# Patient Record
Sex: Male | Born: 2000 | Race: Black or African American | Hispanic: No | Marital: Single | Smoking: Never smoker
Health system: Southern US, Community
[De-identification: ages and names within clinical notes are randomized; demographics above are authoritative.]

---
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
## 2020-01-23 IMAGING — MR MR KNEE*L* W/O CM
7 series · 40 of 40 positions shown · non-contrast
Comparison: None.

CLINICAL DATA: Left knee pain since an injury playing football 5
days ago.

EXAM:
MRI OF THE LEFT KNEE WITHOUT CONTRAST
TECHNIQUE: Multiplanar, multisequence MR imaging of the knee was performed. No
intravenous contrast was administered.

[Series 8: T1 · coronal · left · 4.0mm · 0.70mm/px · 7 of 30 slices shown]
[im 1/30]
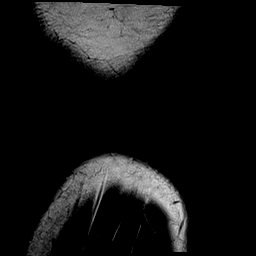
[im 5/30]
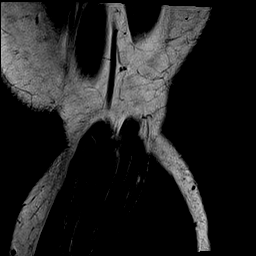
[im 10/30]
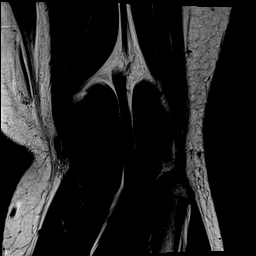
[im 15/30]
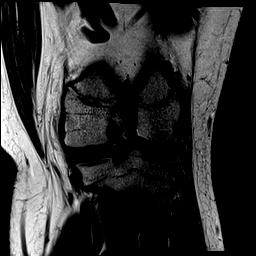
[im 20/30]
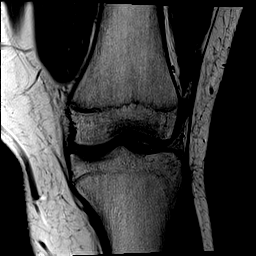
[im 25/30]
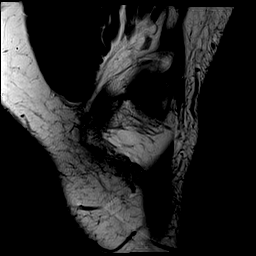
[im 30/30]
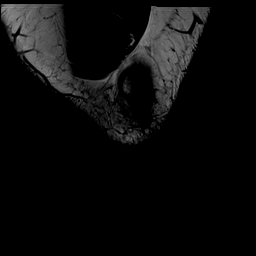

[Series 9: PD fat-sat · coronal · left · 4.0mm · 0.56mm/px · 6 of 30 slices shown (1 of 2)]
[im 1/30]
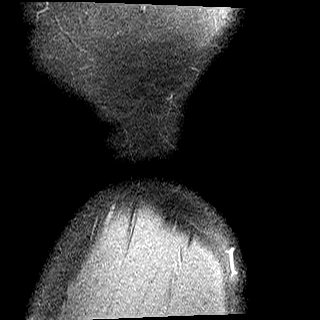
[im 6/30]
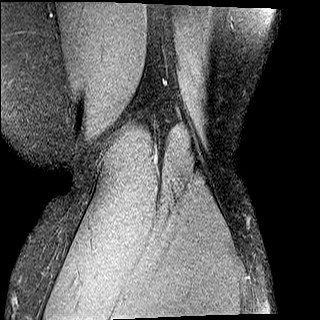
[im 12/30]
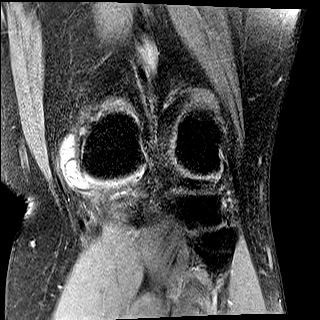
[im 18/30]
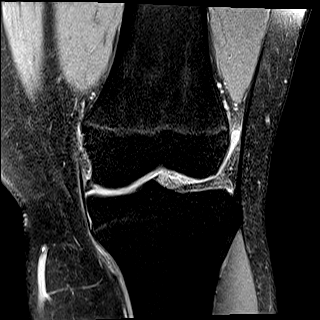
[im 24/30]
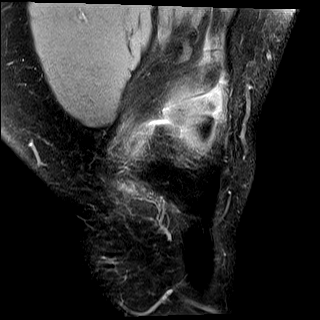
[im 30/30]
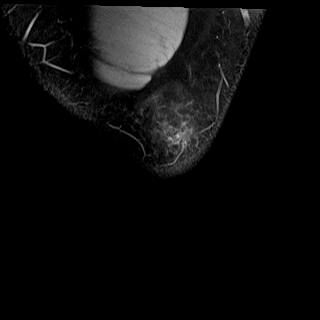

[Series 10: PD fat-sat · sagittal · left · 3.5mm · 0.70mm/px · 6 of 27 slices shown (2 of 2)]
[im 1/27]
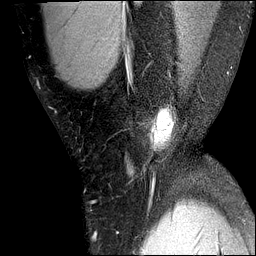
[im 6/27]
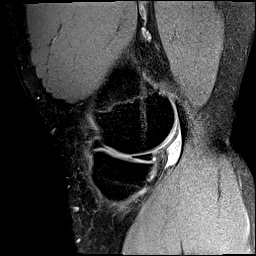
[im 11/27]
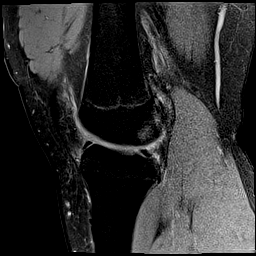
[im 16/27]
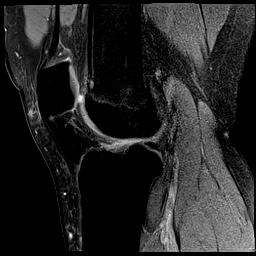
[im 21/27]
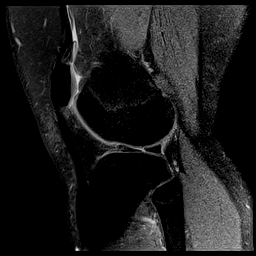
[im 27/27]
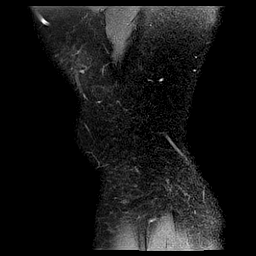

[Series 12: T2 fat-sat · sagittal · left · 3.5mm · 0.70mm/px · 6 of 27 slices shown (1 of 2)]
[im 1/27]
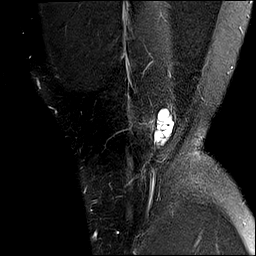
[im 6/27]
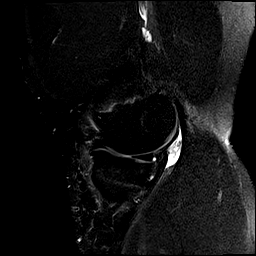
[im 11/27]
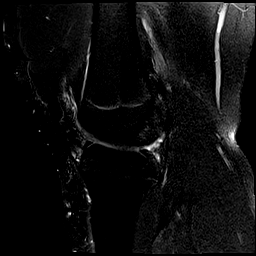
[im 16/27]
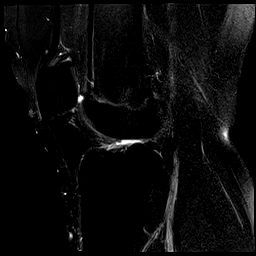
[im 21/27]
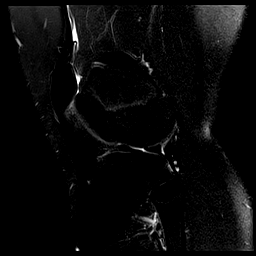
[im 27/27]
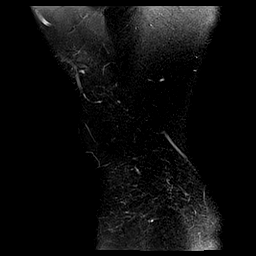

[Series 15: PD · oblique · left · 2.5mm · 0.47mm/px · 4 of 18 slices shown]
[im 1/18]
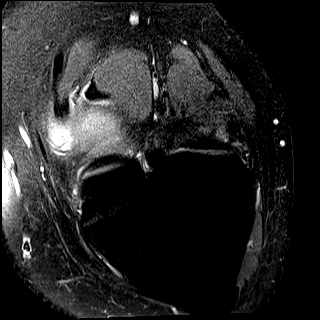
[im 6/18]
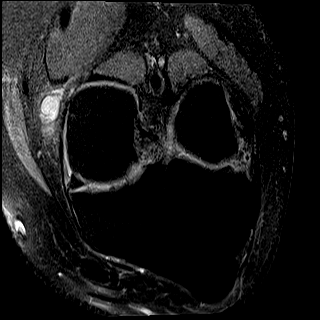
[im 12/18]
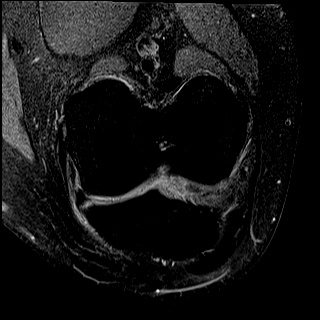
[im 18/18]
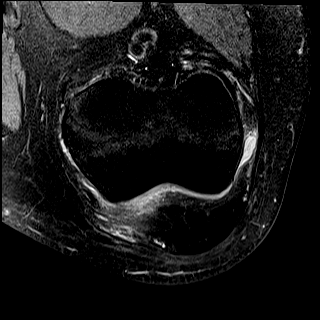

[Series 17: T2 fat-sat · coronal · left · 4.0mm · 0.56mm/px · 6 of 30 slices shown (2 of 2)]
[im 1/30]
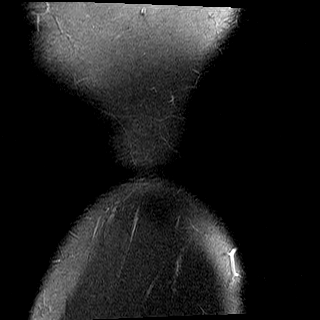
[im 6/30]
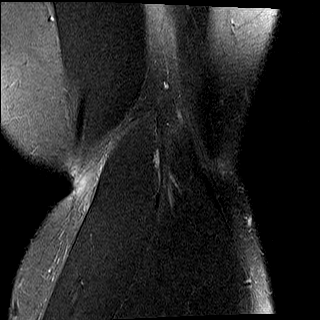
[im 12/30]
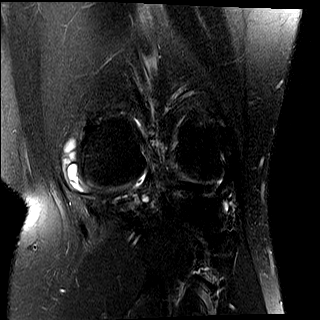
[im 18/30]
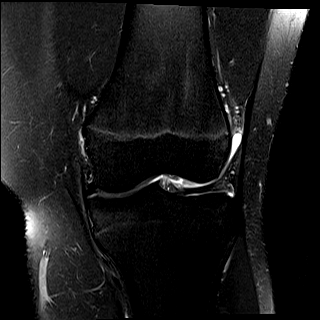
[im 24/30]
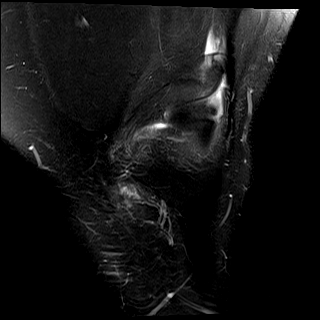
[im 30/30]
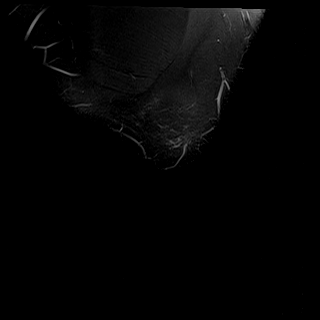

[Series 102: T2 · axial · left · 4.0mm · 0.45mm/px · z∈[-49,+65]mm · 5 of 24 slices shown]
[im 1/24]
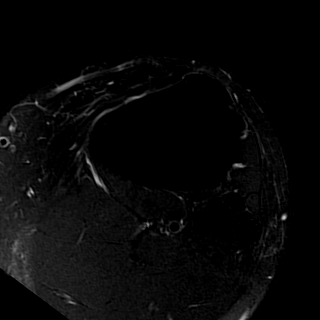
[im 6/24]
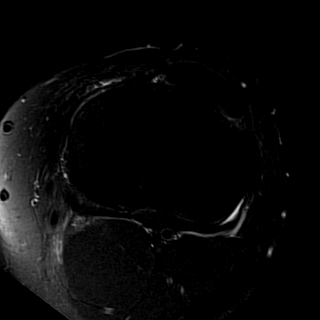
[im 12/24]
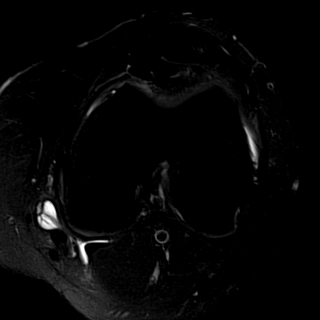
[im 18/24]
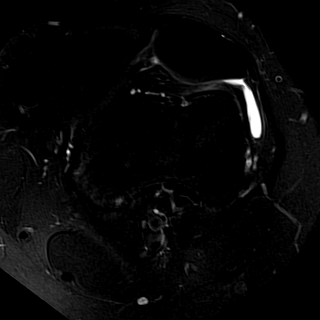
[im 24/24]
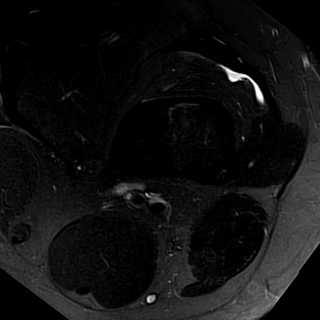

[40 of 40 positions shown; findings below may reference images not displayed]

FINDINGS: The standard knee coil could not be used on this examination due to
the patient's size which mildly limits the study.

MENISCI

Medial meniscus:  Intact.

Lateral meniscus:  There is a large radial tear in the central body.

LIGAMENTS

Cruciates: Intact. Extensive intrasubstance increased T2 signal in
the anterior cruciate ligament consistent with mucoid degeneration
is identified.

Collaterals:  Intact.

CARTILAGE

Patellofemoral:  Preserved.

Medial:  Preserved.

Lateral:  Preserved.

Joint:  Small effusion.

Popliteal Fossa: Baker's cyst measures approximately 0.7 x 3.0 cm in
the axial plane x 4.6 cm craniocaudal.

Extensor Mechanism:  Intact.  Lateral tilt of the patella is noted.

Bones:  Normal marrow signal throughout.

Other: None.
IMPRESSION: Large radial tear central body of the lateral meniscus.

Mucoid degeneration of the ACL without tear.

Baker's cyst.

## 2022-09-07 ENCOUNTER — Other Ambulatory Visit: Payer: Self-pay | Admitting: Nurse Practitioner

## 2022-09-07 DIAGNOSIS — W450XXA Nail entering through skin, initial encounter: Secondary | ICD-10-CM

## 2022-09-07 MED ORDER — TETANUS-DIPHTH-ACELL PERTUSSIS 5-2-15.5 LF-MCG/0.5 IM SUSP: 0.5000 mL | Freq: Once | INTRAMUSCULAR | 0 refills | Status: AC

## 2022-09-07 MED ORDER — SULFAMETHOXAZOLE-TRIMETHOPRIM 800-160 MG PO TABS: 1.0000 | ORAL_TABLET | Freq: Two times a day (BID) | ORAL | 0 refills | Status: AC

## 2023-10-12 ENCOUNTER — Other Ambulatory Visit: Payer: Self-pay | Admitting: Registered Nurse

## 2023-10-12 DIAGNOSIS — R109 Unspecified abdominal pain: Secondary | ICD-10-CM

## 2023-10-13 ENCOUNTER — Ambulatory Visit
Admission: RE | Admit: 2023-10-13 | Discharge: 2023-10-13 | Disposition: A | Discharge status: Home / Self Care | Payer: Medicaid Other | Source: Ambulatory Visit | Attending: Registered Nurse | Admitting: Registered Nurse

## 2023-10-13 DIAGNOSIS — R109 Unspecified abdominal pain: Secondary | ICD-10-CM

## 2023-10-19 ENCOUNTER — Encounter: Payer: Self-pay | Admitting: Physical Therapy

## 2023-10-19 ENCOUNTER — Ambulatory Visit: Payer: Medicaid Other | Attending: Registered Nurse | Admitting: Physical Therapy

## 2023-10-19 ENCOUNTER — Other Ambulatory Visit: Payer: Self-pay

## 2023-10-19 DIAGNOSIS — M6281 Muscle weakness (generalized): Secondary | ICD-10-CM | POA: Insufficient documentation

## 2023-10-19 DIAGNOSIS — M5459 Other low back pain: Secondary | ICD-10-CM | POA: Diagnosis present

## 2023-10-19 NOTE — Therapy (Signed)
OUTPATIENT PHYSICAL THERAPY THORACOLUMBAR EVALUATION  Patient Name: Kyle [name] MRN: [id number] DOB:2000-12-15, 23 y.o., male Today's Date: 10/19/2023   PT End of Session - 10/19/23 1053     Visit Number 1    Number of Visits --   1-2x/week   Date for PT Re-Evaluation 12/14/23    Authorization Type [employer] - FOTO    PT Start Time 0830    PT Stop Time 0905    PT Time Calculation (min) 35 min             History reviewed. No pertinent past medical history. Past Surgical History:  Procedure Laterality Date   KNEE ARTHROSCOPY WITH LATERAL MENISECTOMY Left 05/15/2018   Procedure: LEFT KNEE ARTHROSCOPY WITH LATERAL MENISECTOMY;  Surgeon: Kyle Pippin, MD;  Location: [facility];  Service: Orthopedics;  Laterality: Left;   There are no active problems to display for this patient.   PCP: Patient, No Pcp Per  REFERRING PROVIDER: McCaskill-Gainey, [name]*  THERAPY DIAG:  Other low back pain  Muscle weakness  REFERRING DIAG: Low back pain [M54.50]   Rationale for Evaluation and Treatment:  Rehabilitation  SUBJECTIVE:  PERTINENT PAST HISTORY:  none        PRECAUTIONS: None  WEIGHT BEARING RESTRICTIONS No  FALLS:  Has patient fallen in last 6 months? No, Number of falls: 0  MOI/History of condition:  Onset date: 6 years  SUBJECTIVE STATEMENT  Kyle [name] is a 23 y.o. male who presents to clinic with chief complaint of l sided low back pain.  Pt reports that the pain started when after a football injury about 6 years ago.  He does not remember exactly what happened but "I couldn't move".  The pain is intermittent but comes every day.  He leans to his L when he is sitting to avoid the pain.  He feels that it got worse after he did back squats.  He denies radicular pain pain or n/t.   Red flags:  denies   Pain:  Are you having pain? 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  Assistive Device: na  Hand Dominance: na  Patient Goals/Specific Activities: reduce pain   OBJECTIVE:   DIAGNOSTIC FINDINGS:  NA  GENERAL OBSERVATION/GAIT: WNL  SENSATION: Light touch: Appears intact  LUMBAR AROM  AROM AROM  (Eval)  Flexion Fingertips to knees, w/ concordant pain  Extension WNL  Right lateral flexion WNL  Left lateral flexion WNL  Right rotation WNL  Left rotation WNL    (Blank rows = not tested)   LE MMT:  MMT Right (Eval) Left (Eval)  Hip flexion (L2, L3) 5 4+  Knee extension (L3)    Knee flexion    Hip abduction 4 4  Hip extension 4+* 4+*  Hip external rotation n n  Hip internal rotation n n  Hip adduction    Ankle dorsiflexion (L4)    Ankle plantarflexion (S1)    Ankle inversion    Ankle eversion    Great Toe ext (L5)    Grossly     (Blank rows = not tested, score listed is out of 5 possible points.  N = WNL, D = diminished, C = clear for gross weakness with myotome  testing, * = concordant pain with testing)  SPECIAL TESTS:  Straight leg raise: L (-), R (-) Slump: L (-), R (-)  MUSCLE LENGTH: Hamstrings: Right no restriction; Left no restriction Kyle [name] test: Right significant restriction; Left significant restriction Ely's test: Right no restriction; Left no restriction Piriformis stretch increases back pain  LE ROM:  ROM Right (Eval) Left (Eval)  Hip flexion    Hip extension    Hip abduction    Hip adduction    Hip internal rotation    Hip external rotation  Slightly limited  Knee flexion    Knee extension    Ankle dorsiflexion    Ankle plantarflexion    Ankle inversion    Ankle eversion      (Blank rows = not tested, N = WNL, * = concordant pain with testing)  Functional Tests  Eval    Plank: Pain at ~45''    Side  plank: 24'' R, 5'' pain R                                                        PALPATION:   TTP lumbar paraspinals ~L3  PATIENT SURVEYS:  FOTO 42 -> 59   TODAY'S TREATMENT  Therapeutic Exercise: Creating, reviewing, and completing below HEPs  PATIENT EDUCATION:  POC, diagnosis, prognosis, HEP, and outcome measures.  Pt educated via explanation, demonstration, and handout (HEP).  Pt confirms understanding verbally.   HOME EXERCISE PROGRAM: Access Code: [email or web address] URL: [email or web address] Date: 10/19/2023 Prepared by: Kyle [name]  Exercises - Supine Single Knee to Chest Stretch  - 2-3 x daily - 7 x weekly - 1 sets - 3 reps - 45 hold - Bird Dog  - 1 x daily - 5-7 x weekly - 1 sets - 10 reps - 10 second hold - Side Plank on Knees  - 1 x daily - 5-7 x weekly - 1 sets - 3 reps - 10-45 second hold  Treatment priorities   Eval        Lumbar flexion        Strengthen extensors        Bil RF? And L hip ER        DL progression        TDN          ASSESSMENT:  CLINICAL IMPRESSION: Kyle [name] is a 23 y.o. male who presents to clinic with signs and sxs consistent with L sided low back pain.  No sign of radiculopathy on exam.  Consistent with chronic lumbar strain from remote trauma.  Pt reports complete resolution of sxs following exercise and stretching.  Kyle [name] will benefit from skilled PT to address relevant deficits and improve comfort with daily activities including the gym and work.  OBJECTIVE IMPAIRMENTS: Pain, lumbar ROM, core and hip strength  ACTIVITY LIMITATIONS: recreation (gym), work [employer], retail), lifting, bending  PERSONAL FACTORS: See medical history and pertinent history   REHAB POTENTIAL: Good  CLINICAL DECISION MAKING: Stable/uncomplicated  EVALUATION COMPLEXITY: Low   GOALS:   SHORT TERM GOALS: Target date: 11/16/2023   Kyle [name] will be >75% HEP compliant to improve carryover between sessions and facilitate independent  management of condition  Evaluation: ongoing Goal status: INITIAL   LONG TERM GOALS: Target date: 12/14/2023   Kyle [name] will improve FOTO score to 59 as a proxy for  functional improvement  Evaluation/Baseline: 42 Goal status: INITIAL    2.  Kyle [name] will self report >/= 50% decrease in pain from evaluation to improve function in daily tasks  Evaluation/Baseline: 10/10 max pain daily Goal status: INITIAL   3.  Davey will be able to maintain side plank from feet for 25'' (norm for healthy adult is 20 - 30'' from feet)   Evaluation/Baseline: L 24''  R 5'' from feet Goal status: INITIAL   4.  Regie will be able to maintain plank for 70'' from feet (norm for healthy adult is ~70'' from feet)   Evaluation/Baseline: 45'' from feet with pain Goal status: INITIAL   5.  Lambert will be able to complete gym workouts and work [employer], retail), not limited by pain  Evaluation/Baseline: limited Goal status: INITIAL   6.  Torrez will report confidence in self management of condition at time of discharge with advanced HEP  Evaluation/Baseline: unable to self manage Goal status: INITIAL   PLAN: PT FREQUENCY: 1-2x/week  PT DURATION: 8 weeks  PLANNED INTERVENTIONS:  97164- PT Re-evaluation, 97110-Therapeutic exercises, 97530- Therapeutic activity, [id number]- Neuromuscular re-education, 97535- Self Care, 16109- Manual therapy, [id number]- Gait training, [id number]- Aquatic Therapy, [id number]- Electrical stimulation (manual), [id number]- Vasopneumatic device, [id number]- Traction (mechanical), [id number]- Ionotophoresis 4mg /ml Dexamethasone, Taping, Dry Needling, Joint manipulation, and Spinal manipulation.   Kyle [name] PT, DPT 10/19/2023, 10:56 AM  I just finished a MCD eval/recert.  Name: RMANI SALVAS  MRN: [id number] Please request 1x/week for 8 weeks.  Check all conditions that are expected to impact treatment: Musculoskeletal disorders   Check all possible CPT codes: 19147- Therapeutic Exercise, [phone]- Neuro  Re-education, [phone] - Gait Training, [phone] - Manual Therapy, 97530 - Therapeutic Activities, 97535 - Self Care, [phone] - Re-evaluation, [id number] - Mechanical traction, and [id number] - Aquatic therapy   Thank you!  MCD - Secure

## 2023-10-20 ENCOUNTER — Encounter: Payer: Self-pay | Admitting: Physical Medicine and Rehabilitation

## 2023-10-20 ENCOUNTER — Ambulatory Visit: Payer: Medicaid Other | Admitting: Physical Medicine and Rehabilitation

## 2023-10-20 ENCOUNTER — Other Ambulatory Visit (INDEPENDENT_AMBULATORY_CARE_PROVIDER_SITE_OTHER): Payer: Medicaid Other

## 2023-10-20 DIAGNOSIS — M7918 Myalgia, other site: Secondary | ICD-10-CM | POA: Diagnosis not present

## 2023-10-20 DIAGNOSIS — M545 Low back pain, unspecified: Secondary | ICD-10-CM | POA: Diagnosis not present

## 2023-10-20 DIAGNOSIS — G8929 Other chronic pain: Secondary | ICD-10-CM | POA: Diagnosis not present

## 2023-10-20 NOTE — Progress Notes (Signed)
Kyle [name] - 23 y.o. male MRN [id number]  Date of birth: Feb 20, 2001  Office Visit Note: Visit Date: 10/20/2023 PCP: Aurelio Brash, FNP Referred by: No ref. provider found  Subjective: Chief Complaint  Patient presents with   Lower Back - Pain   HPI: Kyle [name] is a 23 y.o. male who comes in today per the request of Aurelio Brash, NP for evaluation of chronic, worsening and severe left sided lower back pain. Pain ongoing since football injury while in high school, circa 2018. States his pain did resolve for several years initially, however is now more constant. His pain becomes worse with movement and activity. He describes pain as sore and aching sensation, currently rates as 9 out of 10. Some relief of pain with home exercise regimen, rest and use of medications. No relief of pain with Ibuprofen. He recently started physical therapy on [address] yesterday. No history of lumbar surgery/injections. He was previously evaluated by Dr. Ramond Marrow with [facility] for both lower back and left knee pain. He underwent left knee arthroscopy with Dr. Everardo Pacific in 2019. Patient denies focal weakness numbness and tingling. No recent trauma or falls.      Review of Systems  Musculoskeletal:  Positive for back pain.  Neurological:  Negative for tingling, sensory change, focal weakness and weakness.  All other systems reviewed and are negative.  Otherwise per HPI.  Assessment & Plan: Visit Diagnoses:    ICD-10-CM   1. Chronic bilateral low back pain without sciatica  M54.50 XR Lumbar Spine Complete   G89.29     2. Myofascial pain syndrome  M79.18        Plan: Findings:  Chronic, worsening and severe left sided lower back pain. No radicular symptoms down the legs. Patient continues to have severe pain despite good conservative therapies such as formal physical therapy, home exercise regimen, rest and use of medications. Patients clinical presentation  and exam are complex, his symptoms do seem to fit with more myofascial pain. I obtained lumbar radiographs in the office today that show normal anatomical alignment, well preserved disc spacing, no listhesis, no pars defects. X-rays are fairly normal for his age. We discussed treatment plan in detail today. I would like patient to continue with formal physical therapy, he can continue with daily home exercise regimen/resistance training. I cautioned him to avoid any exercises that cause worsening pain. I also discussed obtaining lumbar MRI imaging due to chronicity of pain. He would like to call his insurance company to check on out of pocket costs for MRI imaging. He will let us know if he would like to proceed with lumbar MRI imaging. He has no questions at this time. No red flag symptoms noted upon exam today.     Meds & Orders: No orders of the defined types were placed in this encounter.   Orders Placed This Encounter  Procedures   XR Lumbar Spine Complete    Follow-up: Return if symptoms worsen or fail to improve.   Procedures: No procedures performed      Clinical History: No specialty comments available.   He reports that he has never smoked. He has never used smokeless tobacco. No results for input(s): "HGBA1C", "LABURIC" in the last 8760 hours.  Objective:  VS:  HT:    WT:   BMI:     BP:   HR: bpm  TEMP: ( )  RESP:  Physical Exam Vitals and nursing note reviewed.  HENT:  Head: Normocephalic and atraumatic.     Right Ear: External ear normal.     Left Ear: External ear normal.     Nose: Nose normal.     Mouth/Throat:     Mouth: Mucous membranes are moist.  Eyes:     Extraocular Movements: Extraocular movements intact.  Cardiovascular:     Rate and Rhythm: Normal rate.     Pulses: Normal pulses.  Pulmonary:     Effort: Pulmonary effort is normal.  Abdominal:     General: Abdomen is flat. There is no distension.  Musculoskeletal:        General: Tenderness  present.     Cervical back: Normal range of motion.     Comments: Patient rises from seated position to standing without difficulty. Good lumbar range of motion. No pain noted with facet loading. 5/5 strength noted with bilateral hip flexion, knee flexion/extension, ankle dorsiflexion/plantarflexion and EHL. No clonus noted bilaterally. No pain upon palpation of greater trochanters. No pain with internal/external rotation of bilateral hips. Sensation intact bilaterally. Myofascial tenderness noted to left lumbar paraspinal region. Negative slump test bilaterally. Ambulates without aid, gait steady.     Skin:    General: Skin is warm and dry.     Capillary Refill: Capillary refill takes less than 2 seconds.  Neurological:     General: No focal deficit present.     Mental Status: He is alert and oriented to person, place, and time.  Psychiatric:        Mood and Affect: Mood normal.        Behavior: Behavior normal.     Ortho Exam  Imaging: XR Lumbar Spine Complete Result Date: 10/20/2023 Radiographs of lumbar spine show normal anatomical alignment, well preserved disc spacing, no spondylolisthesis. No pars defects. No fractures or dislocations.   Past Medical/Family/Surgical/Social History: Medications & Allergies reviewed per EMR, new medications updated. There are no active problems to display for this patient.  History reviewed. No pertinent past medical history. History reviewed. No pertinent family history. Past Surgical History:  Procedure Laterality Date   KNEE ARTHROSCOPY WITH LATERAL MENISECTOMY Left 05/15/2018   Procedure: LEFT KNEE ARTHROSCOPY WITH LATERAL MENISECTOMY;  Surgeon: Bjorn Pippin, MD;  [facility];  Service: Orthopedics;  Laterality: Left;   Social History   Occupational History   Not on file  Tobacco Use   Smoking status: Never   Smokeless tobacco: Never  Vaping Use   Vaping status: Never Used  Substance and Sexual Activity    Alcohol use: Never   Drug use: Never   Sexual activity: Never

## 2023-10-20 NOTE — Patient Instructions (Signed)

## 2023-10-20 NOTE — Progress Notes (Signed)
Lower back pain.  Highschool football injury 6 years ago.  Constant back pain, he said he went to PT yesterday did some stretches and the pain came back.  Tylenol for pain but no help.  L > R

## 2023-11-03 ENCOUNTER — Encounter: Payer: Self-pay | Admitting: Physical Therapy

## 2023-11-03 ENCOUNTER — Ambulatory Visit: Payer: Medicaid Other | Attending: Registered Nurse | Admitting: Physical Therapy

## 2023-11-03 DIAGNOSIS — M5459 Other low back pain: Secondary | ICD-10-CM | POA: Diagnosis present

## 2023-11-03 DIAGNOSIS — M6281 Muscle weakness (generalized): Secondary | ICD-10-CM | POA: Diagnosis present

## 2023-11-03 NOTE — Therapy (Signed)
 OUTPATIENT PHYSICAL THERAPY DAILY NOTE  Patient Name: Kyle [name] MRN: [id number] DOB:November 12, 2000, 23 y.o., male Today's Date: 11/03/2023   PT End of Session - 11/03/23 0915     Visit Number 2    Number of Visits --   1-2x/week   Date for PT Re-Evaluation 12/14/23    Authorization Type [employer] - FOTO    PT Start Time 0915    PT Stop Time 0957    PT Time Calculation (min) 42 min             History reviewed. No pertinent past medical history. Past Surgical History:  Procedure Laterality Date   KNEE ARTHROSCOPY WITH LATERAL MENISECTOMY Left 05/15/2018   Procedure: LEFT KNEE ARTHROSCOPY WITH LATERAL MENISECTOMY;  Surgeon: Cristy Bonner DASEN, MD;  Location:  [facility];  Service: Orthopedics;  Laterality: Left;   There are no active problems to display for this patient.   PCP: McCaskill-Gainey, Tenesa, FNP  REFERRING PROVIDER: McCaskill-Gainey, Tenes*  THERAPY DIAG:  Other low back pain  Muscle weakness  REFERRING DIAG: Low back pain [M54.50]   Rationale for Evaluation and Treatment:  Rehabilitation  SUBJECTIVE:  PERTINENT PAST HISTORY:  none        PRECAUTIONS: None  WEIGHT BEARING RESTRICTIONS No  FALLS:  Has patient fallen in last 6 months? No, Number of falls: 0  MOI/History of condition:  Onset date: 6 years  SUBJECTIVE STATEMENT  11/03/2023: Pt reports that overall he is feeling better.  He is completing his HEP stretches especially when it hurts.    EVAL: Kyle [name] is a 23 y.o. male who presents to clinic with chief complaint of l sided low back pain.  Pt reports that the pain started when after a football injury about 6 years ago.  He does not remember exactly what happened but I couldn't move.  The pain is intermittent but comes every day.  He leans to his L when he is sitting to avoid the pain.  He feels that it got worse after he did back squats.  He denies radicular pain pain or n/t.   Red flags:  denies   Pain:  Are  you having pain? 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  Assistive Device: na  Hand Dominance: na  Patient Goals/Specific Activities: reduce pain   OBJECTIVE:   DIAGNOSTIC FINDINGS:  NA  GENERAL OBSERVATION/GAIT: WNL  SENSATION: Light touch: Appears intact  LUMBAR AROM  AROM AROM  (Eval)  Flexion Fingertips to knees, w/ concordant pain  Extension WNL  Right lateral flexion WNL  Left lateral flexion WNL  Right rotation WNL  Left rotation WNL    (Blank rows = not tested)   LE MMT:  MMT Right (Eval) Left (Eval)  Hip flexion (L2, L3) 5 4+  Knee extension (L3)    Knee flexion    Hip abduction 4 4  Hip extension 4+* 4+*  Hip external rotation n n  Hip internal rotation n n  Hip adduction    Ankle dorsiflexion (L4)    Ankle plantarflexion (S1)    Ankle inversion    Ankle eversion    Great Toe ext (L5)    Grossly     (Blank rows = not tested,  score listed is out of 5 possible points.  N = WNL, D = diminished, C = clear for gross weakness with myotome testing, * = concordant pain with testing)  SPECIAL TESTS:  Straight leg raise: L (-), R (-) Slump: L (-), R (-)  MUSCLE LENGTH: Hamstrings: Right no restriction; Left no restriction Debby test: Right significant restriction; Left significant restriction Ely's test: Right no restriction; Left no restriction Piriformis stretch increases back pain  LE ROM:  ROM Right (Eval) Left (Eval)  Hip flexion    Hip extension    Hip abduction    Hip adduction    Hip internal rotation    Hip external rotation  Slightly limited  Knee flexion    Knee extension    Ankle dorsiflexion    Ankle plantarflexion    Ankle inversion    Ankle eversion      (Blank rows  = not tested, N = WNL, * = concordant pain with testing)  Functional Tests  Eval    Plank: Pain at ~45''    Side plank: 24'' R, 5'' pain R                                                        PALPATION:   TTP lumbar paraspinals ~L3  PATIENT SURVEYS:  FOTO 42 -> 59   TODAY'S TREATMENT  [facility] Adult PT Treatment  11/03/2023 :  Therapeutic Exercise: nu-step L5 52m while taking subjective and planning session with patient Prone leg lifts - 5'' holds - 2x10 Side plank from knees with clam - RTB - 2x10 Bridge from ball - 5'' hold - 2x10  Manual therapy: Skilled palpation to identify trigger points for TDN S/L ql stretch (L) STM to all listed muscles following TDN  Trigger Point Dry Needling  Initial Treatment: Pt instructed on Dry Needling rational, procedures, and possible side effects. Pt instructed to expect mild to moderate muscle soreness later in the day and/or into the next day.  Pt instructed in methods to reduce muscle soreness. Because Dry Needling was performed over or adjacent to a lung field, pt was educated on S/S of pneumothorax and to seek immediate medical attention should they occur.  Patient was educated on signs and symptoms of infection and other risk factors and advised to seek medical attention should they occur.  Patient verbalized understanding of these instructions and education.   Patient Verbal Consent Given: Yes Education Handout Provided: Yes Muscles Treated: bil lumbar paraspinals L3 - L5 Electrical Stimulation Performed: 10 min low frequency - milli amps - low intensity Treatment Response/Outcome: twitch      HOME EXERCISE PROGRAM: Access Code: [email or web address] URL: [email or web address] Date: 11/03/2023 Prepared by: Helene Gasmen  Exercises - Supine Single Knee to Chest Stretch  - 2-3 x daily - 7 x weekly - 1 sets - 3 reps - 45 hold - Bird Dog  - 1 x daily - 5-7 x weekly - 1 sets - 10 reps - 10 second hold -  Beginner Prone Single Leg Raise  - 1 x daily - 5-7 x weekly - 2 sets - 10 reps - 10 second hold - Side Plank with Clam and Resistance  - 1 x daily - 5-7 x weekly - 2 sets - 10 reps  Treatment priorities   Eval  Lumbar flexion        Strengthen extensors        Bil RF? And L hip ER        DL progression        TDN          ASSESSMENT:  CLINICAL IMPRESSION:  11/03/2023: Paxon tolerated session well with no adverse reaction.  Trialed TDN with estim with pt reports reduced muscle tension following.  Increased intensity of lumbar extensor exercises to include prone leg lifts which was well tolerated.  HEP updated accordingly.  EVAL:  Satish is a 23 y.o. male who presents to clinic with signs and sxs consistent with L sided low back pain.  No sign of radiculopathy on exam.  Consistent with chronic lumbar strain from remote trauma.  Pt reports complete resolution of sxs following exercise and stretching.  Estanislado will benefit from skilled PT to address relevant deficits and improve comfort with daily activities including the gym and work.  OBJECTIVE IMPAIRMENTS: Pain, lumbar ROM, core and hip strength  ACTIVITY LIMITATIONS: recreation (gym), work buyer, retail), lifting, bending  PERSONAL FACTORS: See medical history and pertinent history   REHAB POTENTIAL: Good  CLINICAL DECISION MAKING: Stable/uncomplicated  EVALUATION COMPLEXITY: Low   GOALS:   SHORT TERM GOALS: Target date: 11/16/2023   Mariah will be >75% HEP compliant to improve carryover between sessions and facilitate independent management of condition  Evaluation: ongoing Goal status: MET   LONG TERM GOALS: Target date: 12/14/2023   Grigor will improve FOTO score to 59 as a proxy for functional improvement  Evaluation/Baseline: 42 Goal status: INITIAL    2.  Sam will self report >/= 50% decrease in pain from evaluation to improve function in daily tasks  Evaluation/Baseline: 10/10 max pain daily Goal  status: INITIAL   3.  Tavish will be able to maintain side plank from feet for 25'' (norm for healthy adult is 20 - 30'' from feet)   Evaluation/Baseline: L 24''  R 5'' from feet Goal status: INITIAL   4.  Aarav will be able to maintain plank for 70'' from feet (norm for healthy adult is ~70'' from feet)   Evaluation/Baseline: 45'' from feet with pain Goal status: INITIAL   5.  Aladdin will be able to complete gym workouts and work buyer, retail), not limited by pain  Evaluation/Baseline: limited Goal status: INITIAL   6.  Kedrick will report confidence in self management of condition at time of discharge with advanced HEP  Evaluation/Baseline: unable to self manage Goal status: INITIAL   PLAN: PT FREQUENCY: 1-2x/week  PT DURATION: 8 weeks  PLANNED INTERVENTIONS:  97164- PT Re-evaluation, 97110-Therapeutic exercises, 97530- Therapeutic activity, [id number]- Neuromuscular re-education, 97535- Self Care, 02859- Manual therapy, [id number]- Gait training, [id number]- Aquatic Therapy, [id number]- Electrical stimulation (manual), [id number]- Vasopneumatic device, [id number]- Traction (mechanical), [id number]- Ionotophoresis 4mg /ml Dexamethasone , Taping, Dry Needling, Joint manipulation, and Spinal manipulation.   Helene Gasmen PT, DPT 11/03/2023, 10:45 AM  I just finished a MCD eval/recert.  Name: VINSON TIETZE  MRN: [id number] Please request 1x/week for 8 weeks.  Check all conditions that are expected to impact treatment: Musculoskeletal disorders   Check all possible CPT codes: 02889- Therapeutic Exercise, [phone]- Neuro Re-education, [phone] - Gait Training, [phone] - Manual Therapy, 97530 - Therapeutic Activities, 97535 - Self Care, [phone] - Re-evaluation, [id number] - Mechanical traction, and [id number] - Aquatic therapy   Thank you!  MCD - Secure

## 2023-11-03 NOTE — Patient Instructions (Signed)

## 2023-11-10 ENCOUNTER — Ambulatory Visit: Payer: Medicaid Other | Admitting: Physical Therapy

## 2023-11-10 ENCOUNTER — Encounter: Payer: Self-pay | Admitting: Physical Therapy

## 2023-11-10 DIAGNOSIS — M5459 Other low back pain: Secondary | ICD-10-CM | POA: Diagnosis not present

## 2023-11-10 DIAGNOSIS — M6281 Muscle weakness (generalized): Secondary | ICD-10-CM

## 2023-11-10 NOTE — Therapy (Signed)
OUTPATIENT PHYSICAL THERAPY DAILY NOTE  Patient Name: Kyle [name] MRN: [id number] DOB:11/27/2000, 23 y.o., male Today's Date: 11/10/2023   PT End of Session - 11/10/23 0917     Visit Number 3    Number of Visits --   1-2x/week   Date for PT Re-Evaluation 12/14/23    Authorization Type [employer] - FOTO    PT Start Time [phone]    PT Stop Time 1000    PT Time Calculation (min) 42 min             History reviewed. No pertinent past medical history. Past Surgical History:  Procedure Laterality Date   KNEE ARTHROSCOPY WITH LATERAL MENISECTOMY Left 05/15/2018   Procedure: LEFT KNEE ARTHROSCOPY WITH LATERAL MENISECTOMY;  Surgeon: Bjorn Pippin, MD;  Location: [facility];  Service: Orthopedics;  Laterality: Left;   There are no active problems to display for this patient.   PCP: Aurelio Brash, FNP  REFERRING PROVIDER: Blima Ledger*  THERAPY DIAG:  Other low back pain  Muscle weakness  REFERRING DIAG: Low back pain [M54.50]   Rationale for Evaluation and Treatment:  Rehabilitation  SUBJECTIVE:  PERTINENT PAST HISTORY:  none        PRECAUTIONS: None  WEIGHT BEARING RESTRICTIONS No  FALLS:  Has patient fallen in last 6 months? No, Number of falls: 0  MOI/History of condition:  Onset date: 6 years  SUBJECTIVE STATEMENT  11/10/2023: Pt reports that his low back pain has come down quite a bit.  He rates the pain 4-5/10 typically now vs 10/10    EVAL: Kyle [name] is a 23 y.o. male who presents to clinic with chief complaint of l sided low back pain.  Pt reports that the pain started when after a football injury about 6 years ago.  He does not remember exactly what happened but "I couldn't move".  The pain is intermittent but comes every day.  He leans to his L when he is sitting to avoid the pain.  He feels that it got worse after he did back squats.  He denies radicular pain pain or n/t.   Red flags:  denies   Pain:  Are  you having pain? 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  Assistive Device: na  Hand Dominance: na  Patient Goals/Specific Activities: reduce pain   OBJECTIVE:   DIAGNOSTIC FINDINGS:  NA  GENERAL OBSERVATION/GAIT: WNL  SENSATION: Light touch: Appears intact  LUMBAR AROM  AROM AROM  (Eval)  Flexion Fingertips to knees, w/ concordant pain  Extension WNL  Right lateral flexion WNL  Left lateral flexion WNL  Right rotation WNL  Left rotation WNL    (Blank rows = not tested)   LE MMT:  MMT Right (Eval) Left (Eval)  Hip flexion (L2, L3) 5 4+  Knee extension (L3)    Knee flexion    Hip abduction 4 4  Hip extension 4+* 4+*  Hip external rotation n n  Hip internal rotation n n  Hip adduction    Ankle dorsiflexion (L4)    Ankle plantarflexion (S1)    Ankle inversion    Ankle eversion    Great Toe ext (L5)    Grossly     (Blank  rows = not tested, score listed is out of 5 possible points.  N = WNL, D = diminished, C = clear for gross weakness with myotome testing, * = concordant pain with testing)  SPECIAL TESTS:  Straight leg raise: L (-), R (-) Slump: L (-), R (-)  MUSCLE LENGTH: Hamstrings: Right no restriction; Left no restriction Kyle Fus test: Right significant restriction; Left significant restriction Ely's test: Right no restriction; Left no restriction Piriformis stretch increases back pain  LE ROM:  ROM Right (Eval) Left (Eval)  Hip flexion    Hip extension    Hip abduction    Hip adduction    Hip internal rotation    Hip external rotation  Slightly limited  Knee flexion    Knee extension    Ankle dorsiflexion    Ankle plantarflexion    Ankle inversion    Ankle eversion      (Blank rows =  not tested, N = WNL, * = concordant pain with testing)  Functional Tests  Eval    Plank: Pain at ~45''    Side plank: 24'' R, 5'' pain R                                                        PALPATION:   TTP lumbar paraspinals ~L3  PATIENT SURVEYS:  FOTO 42 -> 59   TODAY'S TREATMENT  [facility] Adult PT Treatment  11/10/2023 :  Therapeutic Exercise: nu-step L5 34m while taking subjective and planning session with patient Prone HS stretch - 20x with band with 1' hold at end Offerman pose - 45'' x2 Cat camel - 20x Side plank from knees with hip abd - RTB - 2x10  Therapeutic Activity  Bridge with alternating knee ext - 2x10 Lumbar ext over bosu - 10'' hold x6  Manual therapy: Skilled palpation to identify trigger points for TDN S/L ql stretch (L) STM to all listed muscles following TDN  Trigger Point Dry Needling  Initial Treatment: Pt instructed on Dry Needling rational, procedures, and possible side effects. Pt instructed to expect mild to moderate muscle soreness later in the day and/or into the next day.  Pt instructed in methods to reduce muscle soreness. Because Dry Needling was performed over or adjacent to a lung field, pt was educated on S/S of pneumothorax and to seek immediate medical attention should they occur.  Patient was educated on signs and symptoms of infection and other risk factors and advised to seek medical attention should they occur.  Patient verbalized understanding of these instructions and education.   Patient Verbal Consent Given: Yes Education Handout Provided: Yes Muscles Treated: bil lumbar paraspinals L3 - L5 Electrical Stimulation Performed: 8 min low frequency - milli amps - low intensity Treatment Response/Outcome: twitch      HOME EXERCISE PROGRAM: Access Code: [email or web address] URL: [email or web address] Date: 11/10/2023 Prepared by: Alphonzo Severance  Exercises - Child's Pose Stretch  - 1 x daily - 7 x weekly -  1 sets - 3 reps - 45 second hold - Modified Side Plank with Hip Abduction and Resistance  - 1 x daily - 5-7 x weekly - 3 sets - 10 reps - Bird Dog  - 1 x daily - 5-7 x weekly - 1 sets - 10 reps - 10 second hold - Supine Bridge with Knee Extension  and Pelvic Floor Contraction  - 1 x daily - 5-7 x weekly - 2 sets - 10 reps - Beginner Prone Single Leg Raise  - 1 x daily - 5-7 x weekly - 2 sets - 10 reps - 10 second hold  Treatment priorities   Eval 2/13       Lumbar flexion TDN       Strengthen extensors Childs pose       Bil RF? And L hip ER Lumbar ext/DL       DL progression R hip strength       TDN          ASSESSMENT:  CLINICAL IMPRESSION:  11/10/2023: Kyle [name] tolerated session well with no adverse reaction.  Continued with TDN and estim as pt reports significant relief following last treatment added in isometric lumbar ext which was challenging, but tolerated well.  HEP updated accordingly.  EVAL:  Kyle [name] is a 23 y.o. male who presents to clinic with signs and sxs consistent with L sided low back pain.  No sign of radiculopathy on exam.  Consistent with chronic lumbar strain from remote trauma.  Pt reports complete resolution of sxs following exercise and stretching.  Kyle [name] will benefit from skilled PT to address relevant deficits and improve comfort with daily activities including the gym and work.  OBJECTIVE IMPAIRMENTS: Pain, lumbar ROM, core and hip strength  ACTIVITY LIMITATIONS: recreation (gym), work Buyer, retail), lifting, bending  PERSONAL FACTORS: See medical history and pertinent history   REHAB POTENTIAL: Good  CLINICAL DECISION MAKING: Stable/uncomplicated  EVALUATION COMPLEXITY: Low   GOALS:   SHORT TERM GOALS: Target date: 11/16/2023   Kyle [name] will be >75% HEP compliant to improve carryover between sessions and facilitate independent management of condition  Evaluation: ongoing Goal status: MET   LONG TERM GOALS: Target date: 12/14/2023   Kyle [name] will  improve FOTO score to 59 as a proxy for functional improvement  Evaluation/Baseline: 42 Goal status: INITIAL    2.  Kyle [name] will self report >/= 50% decrease in pain from evaluation to improve function in daily tasks  Evaluation/Baseline: 10/10 max pain daily Goal status: INITIAL   3.  Kyle [name] will be able to maintain side plank from feet for 25'' (norm for healthy adult is 20 - 30'' from feet)   Evaluation/Baseline: L 24''  R 5'' from feet Goal status: INITIAL   4.  Kyle [name] will be able to maintain plank for 70'' from feet (norm for healthy adult is ~70'' from feet)   Evaluation/Baseline: 45'' from feet with pain Goal status: INITIAL   5.  Kyle [name] will be able to complete gym workouts and work Buyer, retail), not limited by pain  Evaluation/Baseline: limited Goal status: INITIAL   6.  Kyle [name] will report confidence in self management of condition at time of discharge with advanced HEP  Evaluation/Baseline: unable to self manage Goal status: INITIAL   PLAN: PT FREQUENCY: 1-2x/week  PT DURATION: 8 weeks  PLANNED INTERVENTIONS:  97164- PT Re-evaluation, 97110-Therapeutic exercises, 97530- Therapeutic activity, [id number]- Neuromuscular re-education, 97535- Self Care, 16109- Manual therapy, [id number]- Gait training, [id number]- Aquatic Therapy, [id number]- Electrical stimulation (manual), [id number]- Vasopneumatic device, [id number]- Traction (mechanical), [id number]- Ionotophoresis 4mg /ml Dexamethasone, Taping, Dry Needling, Joint manipulation, and Spinal manipulation.   Alphonzo Severance PT, DPT 11/10/2023, 9:57 AM  I just finished a MCD eval/recert.  Name: Kyle [name]  MRN: [id number] Please request 1x/week for 8 weeks.  Check all conditions that are expected to impact treatment: Musculoskeletal disorders   Check all  possible CPT codes: 56213- Therapeutic Exercise, [phone]- Neuro Re-education, [phone] - Gait Training, [phone] - Manual Therapy, 97530 - Therapeutic Activities, [phone] - Self Care, [phone] - Re-evaluation,  [id number] - Mechanical traction, and [id number] - Aquatic therapy   Thank you!  MCD - Secure

## 2023-11-17 ENCOUNTER — Ambulatory Visit: Payer: Medicaid Other | Admitting: Physical Therapy

## 2023-11-24 ENCOUNTER — Encounter: Payer: Self-pay | Admitting: Physical Therapy

## 2023-11-24 ENCOUNTER — Ambulatory Visit: Payer: Medicaid Other | Admitting: Physical Therapy

## 2023-11-24 DIAGNOSIS — M5459 Other low back pain: Secondary | ICD-10-CM | POA: Diagnosis not present

## 2023-11-24 DIAGNOSIS — M6281 Muscle weakness (generalized): Secondary | ICD-10-CM

## 2023-11-24 NOTE — Therapy (Signed)
 OUTPATIENT PHYSICAL THERAPY DAILY NOTE  Patient Name: Kyle [name] MRN: [id number] DOB:20-Feb-2001, 23 y.o., male Today's Date: 11/24/2023   PT End of Session - 11/24/23 0914     Visit Number 4    Number of Visits --   1-2x/week   Date for PT Re-Evaluation 12/14/23    Authorization Type [employer] - FOTO    Authorization Time Period Approved 7 visits 11/03/23-01/01/24    PT Start Time 0915    PT Stop Time 0957    PT Time Calculation (min) 42 min             History reviewed. No pertinent past medical history. Past Surgical History:  Procedure Laterality Date   KNEE ARTHROSCOPY WITH LATERAL MENISECTOMY Left 05/15/2018   Procedure: LEFT KNEE ARTHROSCOPY WITH LATERAL MENISECTOMY;  Surgeon: Bjorn Pippin, MD;  Location: [facility];  Service: Orthopedics;  Laterality: Left;   There are no active problems to display for this patient.   PCP: Aurelio Brash, FNP  REFERRING PROVIDER: Blima Ledger*  THERAPY DIAG:  Other low back pain  Muscle weakness  REFERRING DIAG: Low back pain [M54.50]   Rationale for Evaluation and Treatment:  Rehabilitation  SUBJECTIVE:  PERTINENT PAST HISTORY:  none        PRECAUTIONS: None  WEIGHT BEARING RESTRICTIONS No  FALLS:  Has patient fallen in last 6 months? No, Number of falls: 0  MOI/History of condition:  Onset date: 6 years  SUBJECTIVE STATEMENT  11/24/2023: Pt report that his back pain max is still a 5/10 but this is happening less often, about 2 days a week.  EVAL: Kyle [name] is a 23 y.o. male who presents to clinic with chief complaint of l sided low back pain.  Pt reports that the pain started when after a football injury about 6 years ago.  He does not remember exactly what happened but "I couldn't move".  The pain is intermittent but comes every day.  He leans to his L when he is sitting to avoid the pain.  He feels that it got worse after he did back squats.  He denies radicular  pain pain or n/t.   Red flags:  denies   Pain:  Are you having pain? 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  Assistive Device: na  Hand Dominance: na  Patient Goals/Specific Activities: reduce pain   OBJECTIVE:   DIAGNOSTIC FINDINGS:  NA  GENERAL OBSERVATION/GAIT: WNL  SENSATION: Light touch: Appears intact  LUMBAR AROM  AROM AROM  (Eval)  Flexion Fingertips to knees, w/ concordant pain  Extension WNL  Right lateral flexion WNL  Left lateral flexion WNL  Right rotation WNL  Left rotation WNL    (Blank rows = not tested)   LE MMT:  MMT Right (Eval) Left (Eval)  Hip flexion (L2, L3) 5 4+  Knee extension (L3)    Knee flexion    Hip abduction 4 4  Hip extension 4+* 4+*  Hip external rotation n n  Hip internal rotation n n  Hip adduction    Ankle dorsiflexion (L4)    Ankle plantarflexion (S1)    Ankle inversion    Ankle eversion    Great Toe ext (L5)  Grossly     (Blank rows = not tested, score listed is out of 5 possible points.  N = WNL, D = diminished, C = clear for gross weakness with myotome testing, * = concordant pain with testing)  SPECIAL TESTS:  Straight leg raise: L (-), R (-) Slump: L (-), R (-)  MUSCLE LENGTH: Hamstrings: Right no restriction; Left no restriction [name] test: Right significant restriction; Left significant restriction Ely's test: Right no restriction; Left no restriction Piriformis stretch increases back pain  LE ROM:  ROM Right (Eval) Left (Eval)  Hip flexion    Hip extension    Hip abduction    Hip adduction    Hip internal rotation    Hip external rotation  Slightly limited  Knee flexion    Knee extension    Ankle dorsiflexion    Ankle plantarflexion     Ankle inversion    Ankle eversion      (Blank rows = not tested, N = WNL, * = concordant pain with testing)  Functional Tests  Eval    Plank: Pain at ~45''    Side plank: 24'' R, 5'' pain R                                                        PALPATION:   TTP lumbar paraspinals ~L3  PATIENT SURVEYS:  FOTO 42 -> 59   TODAY'S TREATMENT  [facility] Adult PT Treatment  11/24/2023 :  Therapeutic Exercise: elliptical 23m while taking subjective and planning session with patient Supine HS stretch - 20x with band with 1' hold at end [place] pose - 45'' x2  Therapeutic Activity  STS DL - 60# - 4V40 STS w/ 98# KB - consider goblet squat next visit Lumbar ext over barbell on squat rack - x10, x10 w/ 45# Education regarding progressive overload and return to lifting at gym  Manual therapy: Skilled palpation to identify trigger points for TDN STM to all listed muscles following TDN  Trigger Point Dry Needling  Initial Treatment: Pt instructed on Dry Needling rational, procedures, and possible side effects. Pt instructed to expect mild to moderate muscle soreness later in the day and/or into the next day.  Pt instructed in methods to reduce muscle soreness. Because Dry Needling was performed over or adjacent to a lung field, pt was educated on S/S of pneumothorax and to seek immediate medical attention should they occur.  Patient was educated on signs and symptoms of infection and other risk factors and advised to seek medical attention should they occur.  Patient verbalized understanding of these instructions and education.   Patient Verbal Consent Given: Yes Education Handout Provided: Yes Muscles Treated: bil lumbar paraspinals L3 - L5 Electrical Stimulation Performed: 10 min low frequency - milli amps - low intensity Treatment Response/Outcome: twitch      HOME EXERCISE PROGRAM: Access Code: [email or web address] URL: [email or web address] Date:  11/10/2023 Prepared by: Alphonzo Severance  Exercises - Child's Pose Stretch  - 1 x daily - 7 x weekly - 1 sets - 3 reps - 45 second hold - Modified Side Plank with Hip Abduction and Resistance  - 1 x daily - 5-7 x weekly - 3 sets - 10 reps - Bird Dog  - 1 x daily - 5-7 x weekly - 1 sets - 10 reps -  10 second hold - Supine Bridge with Knee Extension and Pelvic Floor Contraction  - 1 x daily - 5-7 x weekly - 2 sets - 10 reps - Beginner Prone Single Leg Raise  - 1 x daily - 5-7 x weekly - 2 sets - 10 reps - 10 second hold  Treatment priorities   Eval 2/13       Lumbar flexion TDN       Strengthen extensors Childs pose       Bil RF? And L hip ER Lumbar ext/DL       DL progression R hip strength       TDN          ASSESSMENT:  CLINICAL IMPRESSION:  11/24/2023: Naszir tolerated session well with no adverse reaction.  We continue with combination of progressive loading and TDN/manual to good effect.  He reports he is now having lower frequency of low back pain which is occurring for about an our 2x/week.  We introduced modified gym lifts today including squat and DL with report of tightness but no increase in pain.  We will plan on 1 more visit to make sure he is confident with return to lifting in gym.  EVAL:  Gaston is a 23 y.o. male who presents to clinic with signs and sxs consistent with L sided low back pain.  No sign of radiculopathy on exam.  Consistent with chronic lumbar strain from remote trauma.  Pt reports complete resolution of sxs following exercise and stretching.  Emrick will benefit from skilled PT to address relevant deficits and improve comfort with daily activities including the gym and work.  OBJECTIVE IMPAIRMENTS: Pain, lumbar ROM, core and hip strength  ACTIVITY LIMITATIONS: recreation (gym), work Buyer, retail), lifting, bending  PERSONAL FACTORS: See medical history and pertinent history   REHAB POTENTIAL: Good  CLINICAL DECISION MAKING:  Stable/uncomplicated  EVALUATION COMPLEXITY: Low   GOALS:   SHORT TERM GOALS: Target date: 11/16/2023   Clark will be >75% HEP compliant to improve carryover between sessions and facilitate independent management of condition  Evaluation: ongoing Goal status: MET   LONG TERM GOALS: Target date: 12/14/2023   Melvern will improve FOTO score to 59 as a proxy for functional improvement  Evaluation/Baseline: 42 Goal status: INITIAL    2.  Culver will self report >/= 50% decrease in pain from evaluation to improve function in daily tasks  Evaluation/Baseline: 10/10 max pain daily Goal status: INITIAL   3.  [place] will be able to maintain side plank from feet for 25'' (norm for healthy adult is 20 - 30'' from feet)   Evaluation/Baseline: L 24''  R 5'' from feet Goal status: INITIAL   4.  [place]ou will be able to maintain plank for 70'' from feet (norm for healthy adult is ~70'' from feet)   Evaluation/Baseline: 45'' from feet with pain Goal status: INITIAL   5.  Milon will be able to complete gym workouts and work Buyer, retail), not limited by pain  Evaluation/Baseline: limited Goal status: INITIAL   6.  Fields will report confidence in self management of condition at time of discharge with advanced HEP  Evaluation/Baseline: unable to self manage Goal status: INITIAL   PLAN: PT FREQUENCY: 1-2x/week  PT DURATION: 8 weeks  PLANNED INTERVENTIONS:  97164- PT Re-evaluation, 97110-Therapeutic exercises, 97530- Therapeutic activity, [id number]- Neuromuscular re-education, 97535- Self Care, 65784- Manual therapy, [id number]- Gait training, [id number]- Aquatic Therapy, [id number]- Electrical stimulation (manual), [id number]- Vasopneumatic device, [id number]- Traction (mechanical), [id number]- Ionotophoresis 4mg /ml Dexamethasone, Taping,  Dry Needling, Joint manipulation, and Spinal manipulation.   Alphonzo Severance PT, DPT 11/24/2023, 9:15 AM  I just finished a MCD eval/recert.  Name: SUNIL HUE  MRN:  [id number] Please request 1x/week for 8 weeks.  Check all conditions that are expected to impact treatment: Musculoskeletal disorders   Check all possible CPT codes: 78295- Therapeutic Exercise, [id number]- Neuro Re-education, [phone] - Gait Training, [phone] - Manual Therapy, 97530 - Therapeutic Activities, 97535 - Self Care, [phone] - Re-evaluation, [id number] - Mechanical traction, and [id number] - Aquatic therapy   Thank you!  MCD - Secure

## 2023-12-08 ENCOUNTER — Ambulatory Visit: Payer: Self-pay | Attending: Registered Nurse | Admitting: Physical Therapy

## 2023-12-08 ENCOUNTER — Encounter: Payer: Self-pay | Admitting: Physical Therapy

## 2023-12-08 DIAGNOSIS — M5459 Other low back pain: Secondary | ICD-10-CM | POA: Insufficient documentation

## 2023-12-08 DIAGNOSIS — M6281 Muscle weakness (generalized): Secondary | ICD-10-CM | POA: Diagnosis present

## 2023-12-08 NOTE — Therapy (Signed)
 PHYSICAL THERAPY DISCHARGE SUMMARY  Visits from Start of Care: 5  Current functional level related to goals / functional outcomes: See assessment/goals   Remaining deficits: See assessment/goals   Education / Equipment: HEP and D/C plans  Patient agrees to discharge. Patient goals were met. Patient is being discharged due to being pleased with the current functional level.  Patient Name: Kyle Kyle [name] MRN: [id number] DOB:2001/08/18, 23 y.o., male Today's Date: 12/08/2023   PT End of Session - 12/08/23 0916     Visit Number 5    Number of Visits --   1-2x/week   Date for PT Re-Evaluation 12/14/23    Authorization Type [employer] - FOTO    Authorization Time Period Approved 7 visits 11/03/23-01/01/24    PT Start Time 0915    PT Stop Time 0956    PT Time Calculation (min) 41 min             History reviewed. No pertinent past medical history. Past Surgical History:  Procedure Laterality Date   KNEE ARTHROSCOPY WITH LATERAL MENISECTOMY Left 05/15/2018   Procedure: LEFT KNEE ARTHROSCOPY WITH LATERAL MENISECTOMY;  Surgeon: Kyle Pippin, MD;  [facility];  Service: Orthopedics;  Laterality: Left;   There are no active problems to display for this patient.   PCP: Kyle Brash, FNP  REFERRING PROVIDER: Blima Kyle [name]*  THERAPY DIAG:  Other low back pain  Muscle weakness  REFERRING DIAG: Low back pain [M54.50]   Rationale for Evaluation and Treatment:  Rehabilitation  SUBJECTIVE:  PERTINENT PAST HISTORY:  none        PRECAUTIONS: None  WEIGHT BEARING RESTRICTIONS No  FALLS:  Has patient fallen in last 6 months? No, Number of falls: 0  MOI/History of condition:  Onset date: 6 years  SUBJECTIVE STATEMENT  12/08/2023: Pt reports that his back is doing well.  He reports that over the last 2 weeks his max pain has been about Kyle [name] 4/10.  EVAL: Kyle Kyle [name] BRYLAN Kyle Kyle [name] is Kyle [name] 23 y.o. male who presents to clinic with chief  complaint of l sided low back pain.  Pt reports that the pain started when after Kyle [name] football injury about 6 years ago.  He does not remember exactly what happened but "I couldn't move".  The pain is intermittent but comes every day.  He leans to his L when he is sitting to avoid the pain.  He feels that it got worse after he did back squats.  He denies radicular pain pain or n/t.   Red flags:  denies   Pain:  Are you having pain? 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  Assistive Device: na  Hand Dominance: na  Patient Goals/Specific Activities: reduce pain   OBJECTIVE:   DIAGNOSTIC FINDINGS:  NA  GENERAL OBSERVATION/GAIT: WNL  SENSATION: Light touch: Appears intact  LUMBAR AROM  AROM AROM  (Eval)  Flexion Fingertips to knees, w/ concordant pain  Extension WNL  Right lateral flexion WNL  Left lateral flexion WNL  Right rotation WNL  Left rotation WNL    (Blank rows = not tested)   LE MMT:  MMT Right (Eval) Left (Eval)  Hip flexion (L2, L3) 5 4+  Knee extension (L3)    Knee  flexion    Hip abduction 4 4  Hip extension 4+* 4+*  Hip external rotation n n  Hip internal rotation n n  Hip adduction    Ankle dorsiflexion (L4)    Ankle plantarflexion (S1)    Ankle inversion    Ankle eversion    Great Toe ext (L5)    Grossly     (Blank rows = not tested, score listed is out of 5 possible points.  N = WNL, D = diminished, C = clear for gross weakness with myotome testing, * = concordant pain with testing)  SPECIAL TESTS:  Straight leg raise: L (-), R (-) Slump: L (-), R (-)  MUSCLE LENGTH: Hamstrings: Right no restriction; Left no restriction Kyle Kyle [name] test: Right significant restriction; Left significant  restriction Ely's test: Right no restriction; Left no restriction Piriformis stretch increases back pain  LE ROM:  ROM Right (Eval) Left (Eval)  Hip flexion    Hip extension    Hip abduction    Hip adduction    Hip internal rotation    Hip external rotation  Slightly limited  Knee flexion    Knee extension    Ankle dorsiflexion    Ankle plantarflexion    Ankle inversion    Ankle eversion      (Blank rows = not tested, N = WNL, * = concordant pain with testing)  Functional Tests  Eval    Plank: Pain at ~45''    Side plank: 24'' R, 5'' pain R                                                        PALPATION:   TTP lumbar paraspinals ~L3  PATIENT SURVEYS:  FOTO 42 -> 59   TODAY'S TREATMENT  [facility] Adult PT Treatment  12/08/2023 :  Therapeutic Exercise: elliptical 15m while taking subjective and planning session with patient Marjo Bicker pose - 45'' x2  Therapeutic Activity  STS DL - 16# - 1W96 STS w/ 04# KB - chair squat Education regarding hurt vs harm; nonspecific low back pain; progressive overload Checking goals and reviewing with pt  Manual therapy: Skilled palpation to identify trigger points for TDN STM to all listed muscles following TDN  Trigger Point Dry Needling  Initial Treatment: Pt instructed on Dry Needling rational, procedures, and possible side effects. Pt instructed to expect mild to moderate muscle soreness later in the day and/or into the next day.  Pt instructed in methods to reduce muscle soreness. Because Dry Needling was performed over or adjacent to Kyle [name] lung field, pt was educated on S/S of pneumothorax and to seek immediate medical attention should they occur.  Patient was educated on signs and symptoms of infection and other risk factors and advised to seek medical attention should they occur.  Patient verbalized understanding of these instructions and education.   Patient Verbal Consent Given: Yes Education Handout Provided:  Yes Muscles Treated: bil lumbar paraspinals L3 - L5 Electrical Stimulation Performed: 10 min low frequency - milli amps - low intensity Treatment Response/Outcome: twitch      HOME EXERCISE PROGRAM: Access Code: [email or web address] URL: [email or web address] Date: 11/10/2023 Prepared by: Kyle Kyle [name]  Exercises - Child's Pose Stretch  - 1 x daily - 7 x weekly - 1 sets - 3 reps - 45 second hold - Modified Side  Plank with Hip Abduction and Resistance  - 1 x daily - 5-7 x weekly - 3 sets - 10 reps - Bird Dog  - 1 x daily - 5-7 x weekly - 1 sets - 10 reps - 10 second hold - Supine Bridge with Knee Extension and Pelvic Floor Contraction  - 1 x daily - 5-7 x weekly - 2 sets - 10 reps - Beginner Prone Single Leg Raise  - 1 x daily - 5-7 x weekly - 2 sets - 10 reps - 10 second hold  Treatment priorities   Eval 2/13       Lumbar flexion TDN       Strengthen extensors Childs pose       Bil RF? And L hip ER Lumbar ext/DL       DL progression R hip strength       TDN          ASSESSMENT:  CLINICAL IMPRESSION:  12/08/2023: Kyle Kyle [name] has progressed very well with therapy.  Improved impairments include: low back pain.  Functional improvements include: ability to return to gym and work with relatively low levels of pain.  Progressions needed include: continued work on strength progression at gym.  Barriers to progress include: NA.  Please see GOALS section for progress on short term and long term goals established at evaluation.  I recommend D/C home with HEP; pt agrees with plan.  EVAL:  Kyle Kyle [name] is Kyle [name] 23 y.o. male who presents to clinic with signs and sxs consistent with L sided low back pain.  No sign of radiculopathy on exam.  Consistent with chronic lumbar strain from remote trauma.  Pt reports complete resolution of sxs following exercise and stretching.  Kyle Kyle [name] will benefit from skilled PT to address relevant deficits and improve comfort with daily activities including the gym and  work.  OBJECTIVE IMPAIRMENTS: Pain, lumbar ROM, core and hip strength  ACTIVITY LIMITATIONS: recreation (gym), work Buyer, retail), lifting, bending  PERSONAL FACTORS: See medical history and pertinent history   REHAB POTENTIAL: Good  CLINICAL DECISION MAKING: Stable/uncomplicated  EVALUATION COMPLEXITY: Low   GOALS:   SHORT TERM GOALS: Target date: 11/16/2023   Kyle Kyle [name] will be >75% HEP compliant to improve carryover between sessions and facilitate independent management of condition  Evaluation: ongoing Goal status: MET   LONG TERM GOALS: Target date: 12/14/2023   Kyle Kyle [name] will improve FOTO score to 59 as Kyle [name] proxy for functional improvement  Evaluation/Baseline: 42 3/13: 71 Goal status: MET    2.  Kyle Kyle [name] will self report >/= 50% decrease in pain from evaluation to improve function in daily tasks  Evaluation/Baseline: 10/10 max pain daily 3/13: 4/10 max intermittent Goal status: MET   3.  Kyle Kyle [name] will be able to maintain side plank from feet for 25'' (norm for healthy adult is 20 - 30'' from feet)   Evaluation/Baseline: L 24''  R 5'' from feet 3/13: 25'' ea Goal status: MET   4.  Kyle Kyle [name] will be able to maintain plank for 70'' from feet (norm for healthy adult is ~70'' from feet)   Evaluation/Baseline: 45'' from feet with pain 3/13: 70''  Goal status: MET   5.  Kyle Kyle [name] will be able to complete gym workouts and work Buyer, retail), not limited by pain  Evaluation/Baseline: limited 3/13: MET Goal status: MET   6.  Kyle Kyle [name] will report confidence in self management of condition at time of discharge with advanced HEP  Evaluation/Baseline: unable to self manage 3/13: MET Goal status: MET  PLAN: PT FREQUENCY: 1-2x/week  PT DURATION: 8 weeks  PLANNED INTERVENTIONS:  97164- PT Re-evaluation, 97110-Therapeutic exercises, 97530- Therapeutic activity, [id number]- Neuromuscular re-education, 97535- Self Care, 40981- Manual therapy, [id number]- Gait training, [phone]- Aquatic  Therapy, [phone]- Electrical stimulation (manual), [id number]- Vasopneumatic device, [id number]- Traction (mechanical), [id number]- Ionotophoresis 4mg /ml Dexamethasone, Taping, Dry Needling, Joint manipulation, and Spinal manipulation.   Kyle Kyle [name] PT, DPT 12/08/2023, 9:17 AM  I just finished Kyle [name] MCD eval/recert.  Name: Kyle Kyle [name]  MRN: [id number] Please request 1x/week for 8 weeks.  Check all conditions that are expected to impact treatment: Musculoskeletal disorders   Check all possible CPT codes: 46962- Therapeutic Exercise, [phone]- Neuro Re-education, [phone] - Gait Training, [phone] - Manual Therapy, 97530 - Therapeutic Activities, 97535 - Self Care, [phone] - Re-evaluation, [id number] - Mechanical traction, and [phone] - Aquatic therapy   Thank you!  MCD - Secure

## 2023-12-21 ENCOUNTER — Encounter: Payer: Self-pay | Admitting: Physician Assistant

## 2024-02-13 NOTE — Progress Notes (Deleted)
      Brigitte Canard, PA-C [address] [place], [place]  [address] Phone: [phone]   Gastroenterology Consultation  Referring Provider:     Al Hover Primary Care Physician:  Derek Flavors, FNP Primary Gastroenterologist:  Brigitte Canard, PA-C / *** Reason for Consultation:     Abdomina Pain        HPI:   JOHANNES EVERAGE is a 23 y.o. y/o male referred for consultation & management  by McCaskill-Gainey, Tenesa, FNP.  ***  No past medical history on file.  Past Surgical History:  Procedure Laterality Date   KNEE ARTHROSCOPY WITH LATERAL MENISECTOMY Left 05/15/2018   Procedure: LEFT KNEE ARTHROSCOPY WITH LATERAL MENISECTOMY;  Surgeon: Micheline Ahr, MD;  Location: [facility];  Service: Orthopedics;  Laterality: Left;    Prior to Admission medications   Medication Sig Start Date End Date Taking? Authorizing Provider  acetaminophen  (TYLENOL ) 500 MG tablet Take 1 tablet (500 mg total) by mouth every 6 (six) hours as needed. 11/10/18   LampteyDonley Furth, MD  oseltamivir  (TAMIFLU ) 75 MG capsule Take 1 capsule (75 mg total) by mouth every 12 (twelve) hours. 11/10/18   Lamptey, Donley Furth, MD    No family history on file.   Social History   Tobacco Use   Smoking status: Never   Smokeless tobacco: Never  Vaping Use   Vaping status: Never Used  Substance Use Topics   Alcohol use: Never   Drug use: Never    Allergies as of 02/14/2024   (No Known Allergies)    Review of Systems:    All systems reviewed and negative except where noted in HPI.   Physical Exam:  There were no vitals taken for this visit. No LMP for male patient.  General:   Alert,  Well-developed, well-nourished, pleasant and cooperative in NAD Lungs:  Respirations even and unlabored.  Clear throughout to auscultation.   No wheezes, crackles, or rhonchi. No acute distress. Heart:  Regular rate and rhythm; no murmurs, clicks, rubs, or gallops. Abdomen:  Normal bowel  sounds.  No bruits.  Soft, and non-distended without masses, hepatosplenomegaly or hernias noted.  No Tenderness.  No guarding or rebound tenderness.    Neurologic:  Alert and oriented x3;  grossly normal neurologically. Psych:  Alert and cooperative. Normal mood and affect.  Imaging Studies: No results found.  Labs: CBC No results found for: "WBC", "RBC", "HGB", "HCT", "PLT", "MCV", "MCH", "MCHC", "RDW", "LYMPHSABS", "MONOABS", "EOSABS", "BASOSABS"  CMP  No results found for: "NA", "K", "CL", "CO2", "GLUCOSE", "BUN", "CREATININE", "CALCIUM", "PROT", "ALBUMIN", "AST", "ALT", "ALKPHOS", "BILITOT", "GFRNONAA", "GFRAA"  Assessment and Plan:   SOHAIB VEREEN is a 23 y.o. y/o male has been referred for ***  Follow up ***  Brigitte Canard, PA-C

## 2024-02-14 ENCOUNTER — Ambulatory Visit: Admitting: Physician Assistant
